# Patient Record
Sex: Male | Born: 1985 | Race: Black or African American | Hispanic: No | Marital: Single | Smoking: Never smoker
Health system: Southern US, Community
[De-identification: ages and names within clinical notes are randomized; demographics above are authoritative.]

---
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
## 2006-07-11 ENCOUNTER — Emergency Department (HOSPITAL_COMMUNITY): Admission: EM | Admit: 2006-07-11 | Discharge: 2006-07-11 | Payer: Self-pay | Admitting: Emergency Medicine

## 2006-07-21 ENCOUNTER — Emergency Department (HOSPITAL_COMMUNITY): Admission: EM | Admit: 2006-07-21 | Discharge: 2006-07-21 | Payer: Self-pay | Admitting: Family Medicine

## 2006-08-20 ENCOUNTER — Encounter: Admission: RE | Admit: 2006-08-20 | Discharge: 2006-08-20 | Payer: Self-pay | Admitting: Family Medicine

## 2012-09-28 ENCOUNTER — Emergency Department (INDEPENDENT_AMBULATORY_CARE_PROVIDER_SITE_OTHER): Payer: BC Managed Care – PPO

## 2012-09-28 ENCOUNTER — Emergency Department (HOSPITAL_COMMUNITY)
Admission: EM | Admit: 2012-09-28 | Discharge: 2012-09-28 | Disposition: A | Discharge status: Home / Self Care | Payer: BC Managed Care – PPO | Source: Home / Self Care

## 2012-09-28 ENCOUNTER — Encounter (HOSPITAL_COMMUNITY): Payer: Self-pay | Admitting: *Deleted

## 2012-09-28 DIAGNOSIS — S93409A Sprain of unspecified ligament of unspecified ankle, initial encounter: Secondary | ICD-10-CM

## 2012-09-28 DIAGNOSIS — S93402A Sprain of unspecified ligament of left ankle, initial encounter: Secondary | ICD-10-CM

## 2012-09-28 NOTE — ED Notes (Signed)
Pt      Was      Playing  Basketball  And  He  Turned  His  Ankle    After  Jumping         Pt     Reports  Pain  When he  Bears  Weight  On    The  Affected   Ankle

## 2012-09-28 NOTE — ED Provider Notes (Signed)
   History    CSN: [id number] Arrival date & time 09/28/12  1534  None    Chief Complaint  Patient presents with  . Ankle Pain   (Consider location/radiation/quality/duration/timing/severity/associated sxs/prior Treatment) Patient is a 27 y.o. male presenting with ankle pain. The history is provided by the patient.  Ankle Pain Location:  Ankle Time since incident:  5 days Injury: yes   Mechanism of injury comment:  Rolled ankle playing bball 5 d ago, still sore lat. Ankle location:  L ankle Chronicity:  New Dislocation: no   Foreign body present:  No foreign bodies Prior injury to area:  Yes (h/o ankle fx.)  History reviewed. No pertinent past medical history. History reviewed. No pertinent past surgical history. History reviewed. No pertinent family history. History  Substance Use Topics  . Smoking status: Never Smoker   . Smokeless tobacco: Not on file  . Alcohol Use: No    Review of Systems  Constitutional: Negative.   Musculoskeletal: Positive for joint swelling. Negative for gait problem.  Skin: Negative.     Allergies  Review of patient's allergies indicates no known allergies.  Home Medications  No current outpatient prescriptions on file. BP 129/68  Pulse 47  Temp(Src) 98.4 F (36.9 C) (Oral)  Resp 16  SpO2 100% Physical Exam  Nursing note and vitals reviewed. Constitutional: He is oriented to person, place, and time. He appears well-developed and well-nourished.  Musculoskeletal: He exhibits tenderness.       Left ankle: He exhibits normal range of motion, no swelling, no deformity and normal pulse. Tenderness. Lateral malleolus tenderness found. No medial malleolus, no AITFL, no CF ligament, no posterior TFL, no head of 5th metatarsal and no proximal fibula tenderness found. Achilles tendon normal.       Left foot: Normal.  Neurological: He is alert and oriented to person, place, and time.  Skin: Skin is warm and dry.    ED Course  Procedures  (including critical care time) Labs Reviewed - No data to display Dg Ankle Complete Left  09/28/2012   *RADIOLOGY REPORT*  Clinical Data: Injury to left ankle with lateral pain.  LEFT ANKLE COMPLETE - 3+ VIEW  Comparison: None.  Findings: Focal soft tissue swelling seen overlying the lateral malleolus.  There is no evidence of acute fracture or dislocation. Nonunion versus old avulsion is seen at the tip of the medial malleolus.  Ankle mortise shows normal alignment.  IMPRESSION: No acute fracture identified.   Original Report Authenticated By: Irish Lack, M.D.   1. Ankle sprain, left, initial encounter     MDM  X-rays reviewed and report per radiologist.   Linna Hoff, MD 09/28/12 [phone]

## 2013-06-24 ENCOUNTER — Emergency Department (HOSPITAL_COMMUNITY): Payer: Worker's Compensation

## 2013-06-24 ENCOUNTER — Emergency Department (HOSPITAL_COMMUNITY)
Admission: EM | Admit: 2013-06-24 | Discharge: 2013-06-24 | Disposition: A | Discharge status: Home / Self Care | Payer: Worker's Compensation | Attending: Emergency Medicine | Admitting: Emergency Medicine

## 2013-06-24 ENCOUNTER — Encounter (HOSPITAL_COMMUNITY): Payer: Self-pay | Admitting: Emergency Medicine

## 2013-06-24 DIAGNOSIS — R109 Unspecified abdominal pain: Secondary | ICD-10-CM

## 2013-06-24 DIAGNOSIS — M549 Dorsalgia, unspecified: Secondary | ICD-10-CM | POA: Insufficient documentation

## 2013-06-24 DIAGNOSIS — R1013 Epigastric pain: Secondary | ICD-10-CM

## 2013-06-24 DIAGNOSIS — R110 Nausea: Secondary | ICD-10-CM

## 2013-06-24 DIAGNOSIS — K3189 Other diseases of stomach and duodenum: Secondary | ICD-10-CM | POA: Insufficient documentation

## 2013-06-24 LAB — CBC WITH DIFFERENTIAL/PLATELET
Basophils Absolute: 0 10*3/uL (ref 0.0–0.1)
Basophils Relative: 0 % (ref 0–1)
EOS PCT: 1 % (ref 0–5)
Eosinophils Absolute: 0.1 10*3/uL (ref 0.0–0.7)
HEMATOCRIT: 43.3 % (ref 39.0–52.0)
Hemoglobin: 15.2 g/dL (ref 13.0–17.0)
LYMPHS ABS: 2.3 10*3/uL (ref 0.7–4.0)
LYMPHS PCT: 33 % (ref 12–46)
MCH: 32.1 pg (ref 26.0–34.0)
MCHC: 35.1 g/dL (ref 30.0–36.0)
MCV: 91.4 fL (ref 78.0–100.0)
MONO ABS: 0.7 10*3/uL (ref 0.1–1.0)
Monocytes Relative: 10 % (ref 3–12)
Neutro Abs: 4 10*3/uL (ref 1.7–7.7)
Neutrophils Relative %: 56 % (ref 43–77)
Platelets: 234 10*3/uL (ref 150–400)
RBC: 4.74 MIL/uL (ref 4.22–5.81)
RDW: 12.8 % (ref 11.5–15.5)
WBC: 7.1 10*3/uL (ref 4.0–10.5)

## 2013-06-24 LAB — COMPREHENSIVE METABOLIC PANEL
ALT: 14 U/L (ref 0–53)
AST: 18 U/L (ref 0–37)
Albumin: 4.1 g/dL (ref 3.5–5.2)
Alkaline Phosphatase: 64 U/L (ref 39–117)
BUN: 14 mg/dL (ref 6–23)
CALCIUM: 9.5 mg/dL (ref 8.4–10.5)
CO2: 30 meq/L (ref 19–32)
CREATININE: 1.16 mg/dL (ref 0.50–1.35)
Chloride: 98 mEq/L (ref 96–112)
GFR, EST NON AFRICAN AMERICAN: 86 mL/min — AB (ref >90)
GLUCOSE: 92 mg/dL (ref 70–99)
Potassium: 3.9 mEq/L (ref 3.7–5.3)
Sodium: 140 mEq/L (ref 137–147)
Total Bilirubin: 0.3 mg/dL (ref 0.3–1.2)
Total Protein: 7.5 g/dL (ref 6.0–8.3)

## 2013-06-24 MED ORDER — ONDANSETRON HCL 4 MG/2ML IJ SOLN
4.0000 mg | Freq: Once | INTRAMUSCULAR | Status: AC
  Administered 2013-06-24: 4 mg via INTRAVENOUS
  Filled 2013-06-24: qty 2

## 2013-06-24 MED ORDER — IOHEXOL 300 MG/ML  SOLN
100.0000 mL | Freq: Once | INTRAMUSCULAR | Status: AC | PRN
  Administered 2013-06-24: 100 mL via INTRAVENOUS

## 2013-06-24 MED ORDER — FENTANYL CITRATE 0.05 MG/ML IJ SOLN
50.0000 ug | Freq: Once | INTRAMUSCULAR | Status: AC
  Administered 2013-06-24: 50 ug via INTRAVENOUS
  Filled 2013-06-24: qty 2

## 2013-06-24 MED ORDER — ONDANSETRON 8 MG PO TBDP: 8.0000 mg | ORAL_TABLET | Freq: Three times a day (TID) | ORAL | Status: AC | PRN

## 2013-06-24 MED ORDER — IOHEXOL 300 MG/ML  SOLN
25.0000 mL | Freq: Once | INTRAMUSCULAR | Status: AC | PRN
  Administered 2013-06-24: 25 mL via ORAL

## 2013-06-24 MED ORDER — FAMOTIDINE 20 MG PO TABS: 20.0000 mg | ORAL_TABLET | Freq: Two times a day (BID) | ORAL | Status: AC

## 2013-06-24 MED ORDER — FAMOTIDINE IN NACL 20-0.9 MG/50ML-% IV SOLN
20.0000 mg | Freq: Once | INTRAVENOUS | Status: AC
  Administered 2013-06-24: 20 mg via INTRAVENOUS
  Filled 2013-06-24: qty 50

## 2013-06-24 NOTE — ED Notes (Signed)
Pt returned from CT via stretcher.

## 2013-06-24 NOTE — ED Notes (Signed)
Patient is alert and orientedx4.  Patient was explained discharge instructions and they understood them with no questions.  The patient's significant other, Maple MirzaShambria Mobley is taking the patient home.

## 2013-06-24 NOTE — ED Provider Notes (Addendum)
CSN: [id number]     Arrival date & time 06/24/13  0319 History   First MD Initiated Contact with Patient 06/24/13 0500     Chief Complaint  Patient presents with  . Abdominal Pain  . Back Pain     (Consider location/radiation/quality/duration/timing/severity/associated sxs/prior Treatment) HPI 28 year old male presents to emergency department with complaint of abdominal and right flank pain.  Patient reports he had an accident at work March 31 where he was pinned him between bleachers and the steering wheel.  He was seen at [employer] [facility] a day or 2 later, and told he had an abdominal bruise.  Patient reports starting about a week ago, 3-4 days after the injury, he began to have worsening pain, mainly in the epigastrium, nausea, with eating, diarrhea, and change in his stool color. No past medical history on file. No past surgical history on file. No family history on file. History  Substance Use Topics  . Smoking status: Never Smoker   . Smokeless tobacco: Not on file  . Alcohol Use: No    Review of Systems  See History of Present Illness; otherwise all other systems are reviewed and negative   Allergies  Review of patient's allergies indicates no known allergies.  Home Medications  No current outpatient prescriptions on file. BP 114/70  Pulse 70  Temp(Src) 98.2 F (36.8 C) (Oral)  Resp 20  Wt 174 lb 12.8 oz (79.289 kg)  SpO2 98% Physical Exam  Nursing note and vitals reviewed. Constitutional: He is oriented to person, place, and time. He appears well-developed and well-nourished. No distress.  HENT:  Head: Normocephalic and atraumatic.  Nose: Nose normal.  Mouth/Throat: Oropharynx is clear and moist.  Eyes: Conjunctivae and EOM are normal. Pupils are equal, round, and reactive to light.  Neck: Normal range of motion. Neck supple. No JVD present. No tracheal deviation present. No thyromegaly present.  Cardiovascular: Normal rate, regular rhythm, normal  heart sounds and intact distal pulses.  Exam reveals no gallop and no friction rub.   No murmur heard. Pulmonary/Chest: Effort normal and breath sounds normal. No stridor. No respiratory distress. He has no wheezes. He has no rales. He exhibits no tenderness.  Abdominal: Soft. Bowel sounds are normal. He exhibits no distension and no mass. There is tenderness (tender to palpation in epigastrium, right flank). There is no rebound and no guarding.  Musculoskeletal: Normal range of motion. He exhibits no edema and no tenderness.  Lymphadenopathy:    He has no cervical adenopathy.  Neurological: He is alert and oriented to person, place, and time. He exhibits normal muscle tone. Coordination normal.  Skin: Skin is warm and dry. No rash noted. No erythema. No pallor.  Psychiatric: He has a normal mood and affect. His behavior is normal. Judgment and thought content normal.    ED Course  Procedures (including critical care time) Labs Review Labs Reviewed  COMPREHENSIVE METABOLIC PANEL - Abnormal; Notable for the following:    GFR calc non Af Amer 86 (*)    All other components within normal limits  CBC WITH DIFFERENTIAL   Imaging Review Ct Abdomen Pelvis W Contrast  06/24/2013   CLINICAL DATA:  Abdominal pain. Nausea and vomiting. Abdominal injury 9 days ago with crush injury. Low back pain.  EXAM: CT ABDOMEN AND PELVIS WITH CONTRAST  TECHNIQUE: Multidetector CT imaging of the abdomen and pelvis was performed using the standard protocol following bolus administration of intravenous contrast.  CONTRAST:  OMNIPAQUE IOHEXOL 300 MG/ML  SOLN  COMPARISON:  None.  FINDINGS: The lung bases are clear without focal nodule, mass, or airspace disease. The heart size is normal. No significant pleural or pericardial effusion is present.  The liver and spleen are within normal limits. The stomach, duodenum, and pancreas are within normal limits. The common bile duct and gallbladder are normal. The adrenal  glands and kidneys are within normal limits. The ureters and urinary bladder are unremarkable. Rectosigmoid colon is within normal limits. The remainder of the colon is unremarkable. The kidneys are visualized and within normal limits. Small bowel is within normal limits is well. There is no significant adenopathy or free fluid.  Bone windows demonstrate no focal lytic or blastic lesions. Vertebral body heights and alignment are normal. There is some straightening of the normal lumbar lordosis.  IMPRESSION: 1. Normal CT of the abdomen and pelvis. No evidence for acute or focal trauma. 2. Mild straightening of the normal lumbar lordosis. While in a osseous trauma is present, this can be seen in the setting of muscle strain or ongoing pain.   Electronically Signed   By: Gennette Pachris  Mattern M.D.   On: 06/24/2013 07:22     EKG Interpretation None      MDM   Final diagnoses:  Abdominal pain  Nausea  Dyspepsia    28 year old male with persistent abdominal pain after abdominal flank injury at work.  Labs are reassuring.  He has mild epigastrium tenderness to palpation.  He denies any excessive use of NSAIDs, Pepto-Bismol.  He does not drink alcohol.  Plan for CT abdomen pelvis, looking for possible delayed duodenal injury.  Symptoms seem more like gastritis, will give a dose of Pepcid here IV.  Olivia Mackielga M Aylssa Herrig, MD 06/24/13 0708  7:34 AM CT scan, normal.  Patient feeling better after medications.  We'll send him home on Zofran and Pepcid, have him stick to a bland diet.  In talking with him, it sounds like he eats a lot of fast food.  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  Olivia Mackielga M Heer Justiss, MD 06/24/13 [phone]

## 2013-06-24 NOTE — ED Notes (Signed)
Pt finished drinking oral CT contrast and CT made aware. 

## 2013-06-24 NOTE — Discharge Instructions (Signed)
Your workup today did not show any signs of abdominal injury.  Your lab work and CT scan were normal.  Stick to a bland diet, avoid spicy foods, fried or greasy foods.  Take medications as prescribed.  Follow up with [facility] in 3-5 days.   Abdominal Pain, Adult Many things can cause abdominal pain. Usually, abdominal pain is not caused by a disease and will improve without treatment. It can often be observed and treated at home. Your health care provider will do a physical exam and possibly order blood tests and X-rays to help determine the seriousness of your pain. However, in many cases, more time must pass before a clear cause of the pain can be found. Before that point, your health care provider may not know if you need more testing or further treatment. HOME CARE INSTRUCTIONS  Monitor your abdominal pain for any changes. The following actions may help to alleviate any discomfort you are experiencing:  Only take over-the-counter or prescription medicines as directed by your health care provider.  Do not take laxatives unless directed to do so by your health care provider.  Try a clear liquid diet (broth, tea, or water) as directed by your health care provider. Slowly move to a bland diet as tolerated. SEEK MEDICAL CARE IF:  You have unexplained abdominal pain.  You have abdominal pain associated with nausea or diarrhea.  You have pain when you urinate or have a bowel movement.  You experience abdominal pain that wakes you in the night.  You have abdominal pain that is worsened or improved by eating food.  You have abdominal pain that is worsened with eating fatty foods. SEEK IMMEDIATE MEDICAL CARE IF:   Your pain does not go away within 2 hours.  You have a fever.  You keep throwing up (vomiting).  Your pain is felt only in portions of the abdomen, such as the right side or the left lower portion of the abdomen.  You pass bloody or black tarry stools. MAKE SURE  YOU:  Understand these instructions.   Will watch your condition.   Will get help right away if you are not doing well or get worse.  Document Released: 12/11/2004 Document Revised: 12/22/2012 Document Reviewed: 11/10/2012 [facility] Patient Information 2014 [place], [place].  Diet for Diarrhea, Adult Frequent, runny stools (diarrhea) may be caused or worsened by food or drink. Diarrhea may be relieved by changing your diet. Since diarrhea can last up to 7 days, it is easy for you to lose too much fluid from the body and become dehydrated. Fluids that are lost need to be replaced. Along with a modified diet, make sure you drink enough fluids to keep your urine clear or pale yellow. DIET INSTRUCTIONS  Ensure adequate fluid intake (hydration): have 1 cup (8 oz) of fluid for each diarrhea episode. Avoid fluids that contain simple sugars or sports drinks, fruit juices, whole milk products, and sodas. Your urine should be clear or pale yellow if you are drinking enough fluids. Hydrate with an oral rehydration solution that you can purchase at pharmacies, retail stores, and online. You can prepare an oral rehydration solution at home by mixing the following ingredients together:    tsp table salt.   tsp baking soda.   tsp salt substitute containing potassium chloride.  1  tablespoons sugar.  1 L (34 oz) of water.  Certain foods and beverages may increase the speed at which food moves through the gastrointestinal (GI) tract. These foods and  beverages should be avoided and include:  Caffeinated and alcoholic beverages.  High-fiber foods, such as raw fruits and vegetables, nuts, seeds, and whole grain breads and cereals.  Foods and beverages sweetened with sugar alcohols, such as xylitol, sorbitol, and mannitol.  Some foods may be well tolerated and may help thicken stool including:  Starchy foods, such as rice, toast, pasta, low-sugar cereal, oatmeal, grits, baked potatoes, crackers, and  bagels.   Bananas.   Applesauce.  Add probiotic-rich foods to help increase healthy bacteria in the GI tract, such as yogurt and fermented milk products. RECOMMENDED FOODS AND BEVERAGES Starches Choose foods with less than 2 g of fiber per serving.  Recommended:  White, [place], and pita breads, plain rolls, buns, bagels. Plain muffins, matzo. Soda, saltine, or graham crackers. Pretzels, melba toast, zwieback. Cooked cereals made with water: cornmeal, farina, cream cereals. Dry cereals: refined corn, wheat, rice. Potatoes prepared any way without skins, refined macaroni, spaghetti, noodles, refined rice.  Avoid:  Bread, rolls, or crackers made with whole wheat, multi-grains, rye, bran seeds, nuts, or coconut. Corn tortillas or taco shells. Cereals containing whole grains, multi-grains, bran, coconut, nuts, raisins. Cooked or dry oatmeal. Coarse wheat cereals, granola. Cereals advertised as "high-fiber." Potato skins. Whole grain pasta, wild or brown rice. Popcorn. Sweet potatoes, yams. Sweet rolls, doughnuts, waffles, pancakes, sweet breads. Vegetables  Recommended: Strained tomato and vegetable juices. Most well-cooked and canned vegetables without seeds. Fresh: Tender lettuce, cucumber without the skin, cabbage, spinach, bean sprouts.  Avoid: Fresh, cooked, or canned: Artichokes, baked beans, beet greens, broccoli, Brussels sprouts, corn, kale, legumes, peas, sweet potatoes. Cooked: Green or red cabbage, spinach. Avoid large servings of any vegetables because vegetables shrink when cooked, and they contain more fiber per serving than fresh vegetables. Fruit  Recommended: Cooked or canned: Apricots, applesauce, cantaloupe, cherries, fruit cocktail, grapefruit, grapes, kiwi, mandarin oranges, peaches, pears, plums, watermelon. Fresh: Apples without skin, ripe banana, grapes, cantaloupe, cherries, grapefruit, peaches, oranges, plums. Keep servings limited to  cup or 1 piece.  Avoid: Fresh:  Apples with skin, apricots, mangoes, pears, raspberries, strawberries. Prune juice, stewed or dried prunes. Dried fruits, raisins, dates. Large servings of all fresh fruits. Protein  Recommended: Ground or well-cooked tender beef, ham, veal, lamb, pork, or poultry. Eggs. Fish, oysters, shrimp, lobster, other seafoods. Liver, organ meats.  Avoid: Tough, fibrous meats with gristle. Peanut butter, smooth or chunky. Cheese, nuts, seeds, legumes, dried peas, beans, lentils. Dairy  Recommended: Yogurt, lactose-free milk, kefir, drinkable yogurt, buttermilk, soy milk, or plain hard cheese.  Avoid: Milk, chocolate milk, beverages made with milk, such as milkshakes. Soups  Recommended: Bouillon, broth, or soups made from allowed foods. Any strained soup.  Avoid: Soups made from vegetables that are not allowed, cream or milk-based soups. Desserts and Sweets  Recommended: Sugar-free gelatin, sugar-free frozen ice pops made without sugar alcohol.  Avoid: Plain cakes and cookies, pie made with fruit, pudding, custard, cream pie. Gelatin, fruit, ice, sherbet, frozen ice pops. Ice cream, ice milk without nuts. Plain hard candy, honey, jelly, molasses, syrup, sugar, chocolate syrup, gumdrops, marshmallows. Fats and Oils  Recommended: Limit fats to less than 8 tsp per day.  Avoid: Seeds, nuts, olives, avocados. Margarine, butter, cream, mayonnaise, salad oils, plain salad dressings. Plain gravy, crisp bacon without rind. Beverages  Recommended: Water, decaffeinated teas, oral rehydration solutions, sugar-free beverages not sweetened with sugar alcohols.  Avoid: Fruit juices, caffeinated beverages (coffee, tea, soda), alcohol, sports drinks, or lemon-lime soda. Condiments  Recommended: Ketchup, mustard, horseradish,  vinegar, cocoa powder. Spices in moderation: allspice, basil, bay leaves, celery powder or leaves, cinnamon, cumin powder, curry powder, ginger, mace, marjoram, onion or garlic powder,  oregano, paprika, parsley flakes, ground pepper, rosemary, sage, savory, tarragon, thyme, turmeric.  Avoid: Coconut, honey. Document Released: 05/24/2003 Document Revised: 11/26/2011 Document Reviewed: 07/18/2011 [facility] Patient Information 2014 [place], [place].  Indigestion Indigestion is discomfort in the upper abdomen that is caused by underlying problems such as gastroesophageal reflux disease (GERD), ulcers, or gallbladder problems.  CAUSES  Indigestion can be caused by many things. Possible causes include:  Stomach acid in the esophagus.  Stomach infections, usually caused by the bacteria H. pylori.  Being overweight.  Hiatal hernia. This means part of the stomach pushes up through the diaphragm.  Overeating.  Emotional problems, such as stress, anxiety, or depression.  Poor nutrition.  Consuming too much alcohol, tobacco, or caffeine.  Consuming spicy foods, fats, peppermint, chocolate, tomato products, citrus, or fruit juices.  Medicines such as aspirin and other anti-inflammatory drugs, hormones, steroids, and thyroid medicines.  Gastroparesis. This is a condition in which the stomach does not empty properly.  Stomach cancer.  Pregnancy, due to an increase in hormone levels, a relaxation of muscles in the digestive tract, and pressure on the stomach from the growing fetus. SYMPTOMS   Uncomfortable feeling of fullness after eating.  Pain or burning sensation in the upper abdomen.  Bloating.  Belching and gas.  Nausea and vomiting.  Acidic taste in the mouth.  Burning sensation in the chest (heartburn). DIAGNOSIS  Your caregiver will review your medical history and perform a physical exam. Other tests, such as blood tests, stool tests, X-rays, and other imaging scans, may be done to check for more serious problems. TREATMENT  Liquid antacids and other drugs may be given to block stomach acid secretion. Medicines that increase esophageal muscle tone may  also be given to help reduce symptoms. If an infection is found, antibiotic medicine may be given. HOME CARE INSTRUCTIONS  Avoid foods and drinks that make your symptoms worse, such as:  Caffeine or alcoholic drinks.  Chocolate.  Peppermint or mint flavorings.  Garlic and onions.  Spicy foods.  Citrus fruits, such as oranges, lemons, or limes.  Tomato-based foods such as sauce, chili, salsa, and pizza.  Fried and fatty foods.  Avoid eating for the 3 hours prior to your bedtime.  Eat small, frequent meals instead of large meals.  Stop smoking if you smoke.  Maintain a healthy weight.  Wear loose-fitting clothing. Do not wear anything tight around your waist that causes pressure on your stomach.  Raise the head of your bed 4 to 8 inches with wood blocks to help you sleep. Extra pillows will not help.  Only take over-the-counter or prescription medicines as directed by your caregiver.  Do not take aspirin, ibuprofen, or other nonsteroidal anti-inflammatory drugs (NSAIDs). SEEK IMMEDIATE MEDICAL CARE IF:   You are not better after 2 days.  You have chest pressure or pain that radiates up into your neck, arms, back, jaw, or upper abdomen.  You have difficulty swallowing.  You keep vomiting.  You have black or bloody stools.  You have a fever.  You have dizziness, fainting, difficulty breathing, or heavy sweating.  You have severe abdominal pain.  You lose weight without trying. MAKE SURE YOU:  Understand these instructions.  Will watch your condition.  Will get help right away if you are not doing well or get worse. Document Released: 04/10/2004 Document Revised:  05/26/2011 Document Reviewed: 10/16/2010 [employer] Patient Information 2014 [employer], [place].  Nausea and Vomiting Nausea is a sick feeling that often comes before throwing up (vomiting). Vomiting is a reflex where stomach contents come out of your mouth. Vomiting can cause severe loss of body  fluids (dehydration). Children and elderly adults can become dehydrated quickly, especially if they also have diarrhea. Nausea and vomiting are symptoms of a condition or disease. It is important to find the cause of your symptoms. CAUSES   Direct irritation of the stomach lining. This irritation can result from increased acid production (gastroesophageal reflux disease), infection, food poisoning, taking certain medicines (such as nonsteroidal anti-inflammatory drugs), alcohol use, or tobacco use.  Signals from the brain.These signals could be caused by a headache, heat exposure, an inner ear disturbance, increased pressure in the brain from injury, infection, a tumor, or a concussion, pain, emotional stimulus, or metabolic problems.  An obstruction in the gastrointestinal tract (bowel obstruction).  Illnesses such as diabetes, hepatitis, gallbladder problems, appendicitis, kidney problems, cancer, sepsis, atypical symptoms of a heart attack, or eating disorders.  Medical treatments such as chemotherapy and radiation.  Receiving medicine that makes you sleep (general anesthetic) during surgery. DIAGNOSIS Your caregiver may ask for tests to be done if the problems do not improve after a few days. Tests may also be done if symptoms are severe or if the reason for the nausea and vomiting is not clear. Tests may include:  Urine tests.  Blood tests.  Stool tests.  Cultures (to look for evidence of infection).  X-rays or other imaging studies. Test results can help your caregiver make decisions about treatment or the need for additional tests. TREATMENT You need to stay well hydrated. Drink frequently but in small amounts.You may wish to drink water, sports drinks, clear broth, or eat frozen ice pops or gelatin dessert to help stay hydrated.When you eat, eating slowly may help prevent nausea.There are also some antinausea medicines that may help prevent nausea. HOME CARE INSTRUCTIONS    Take all medicine as directed by your caregiver.  If you do not have an appetite, do not force yourself to eat. However, you must continue to drink fluids.  If you have an appetite, eat a normal diet unless your caregiver tells you differently.  Eat a variety of complex carbohydrates (rice, wheat, potatoes, bread), lean meats, yogurt, fruits, and vegetables.  Avoid high-fat foods because they are more difficult to digest.  Drink enough water and fluids to keep your urine clear or pale yellow.  If you are dehydrated, ask your caregiver for specific rehydration instructions. Signs of dehydration may include:  Severe thirst.  Dry lips and mouth.  Dizziness.  Dark urine.  Decreasing urine frequency and amount.  Confusion.  Rapid breathing or pulse. SEEK IMMEDIATE MEDICAL CARE IF:   You have blood or brown flecks (like coffee grounds) in your vomit.  You have black or bloody stools.  You have a severe headache or stiff neck.  You are confused.  You have severe abdominal pain.  You have chest pain or trouble breathing.  You do not urinate at least once every 8 hours.  You develop cold or clammy skin.  You continue to vomit for longer than 24 to 48 hours.  You have a fever. MAKE SURE YOU:   Understand these instructions.  Will watch your condition.  Will get help right away if you are not doing well or get worse. Document Released: 03/03/2005 Document Revised: 05/26/2011 Document Reviewed:  07/31/2010 [employer] Patient Information 2014 [place].

## 2013-06-24 NOTE — ED Notes (Signed)
Pt states that he was in an accident at work and is now having lower back pain and abd pain.  Pt states he is "not able to eat like he normally is"  Pt also states black tary stools for two days

## 2013-06-24 NOTE — ED Notes (Signed)
Introduced to myself to the patient and his support person.  Both were asleep and resting.

## 2015-02-16 LAB — HM HEPATITIS C SCREENING LAB: HM Hepatitis Screen: NEGATIVE

## 2015-08-11 IMAGING — CT CT ABD-PELV W/ CM
2 of 5 series · 17 of 46 positions shown, 19 images · IV contrast (omnipaque)
Comparison: None.

CLINICAL DATA: Abdominal pain. Nausea and vomiting. Abdominal
injury 9 days ago with crush injury. Low back pain.

EXAM:
CT ABDOMEN AND PELVIS WITH CONTRAST
TECHNIQUE: Multidetector CT imaging of the abdomen and pelvis was performed
using the standard protocol following bolus administration of
intravenous contrast.
CONTRAST:  100mL OMNIPAQUE IOHEXOL 300 MG/ML  SOLN

[Series 2: routine · axial · 0.62mm/px · z∈[+107,+497]mm · 14 of 88 slices shown, 16 images]
[im 5/88  soft-tissue]
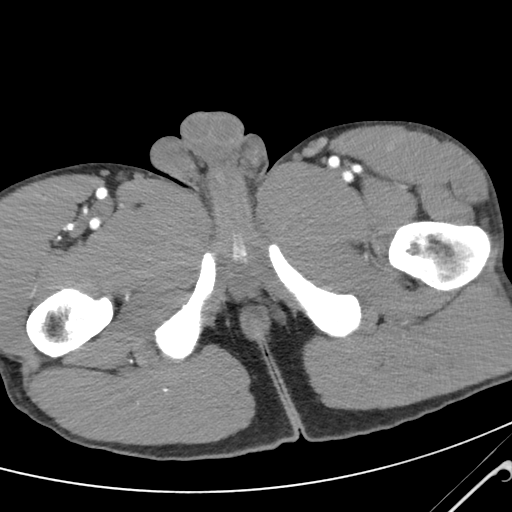
[im 5/88  bone]
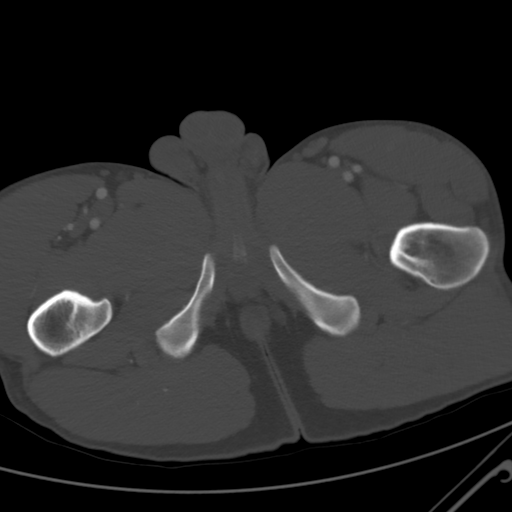
[im 10/88  soft-tissue]
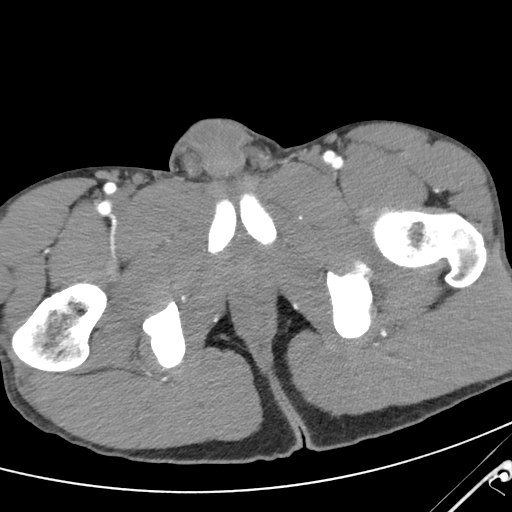
[im 20/88  soft-tissue]
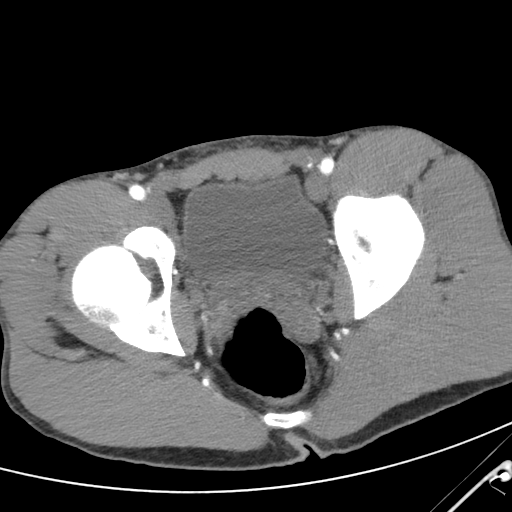
[im 25/88  soft-tissue]
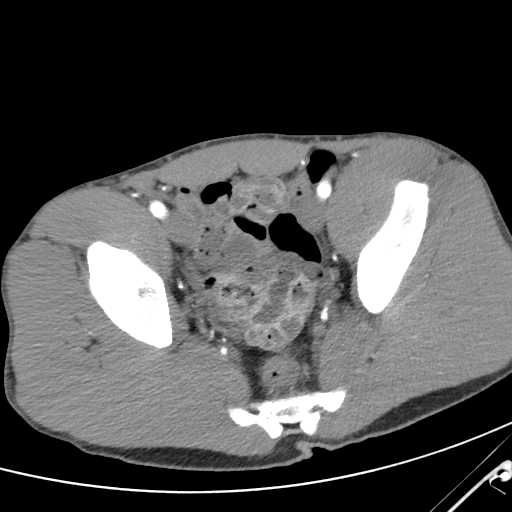
[im 30/88  soft-tissue]
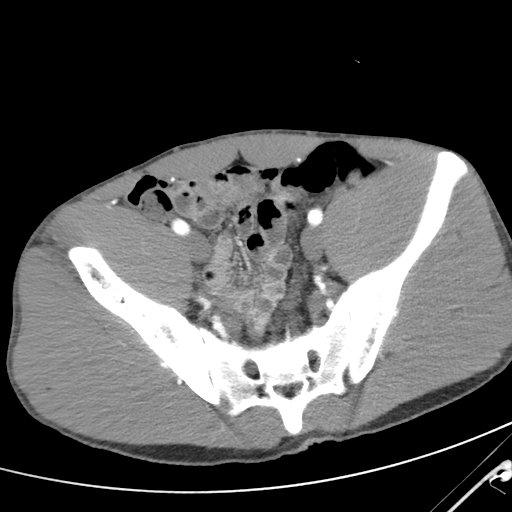
[im 34/88  soft-tissue]
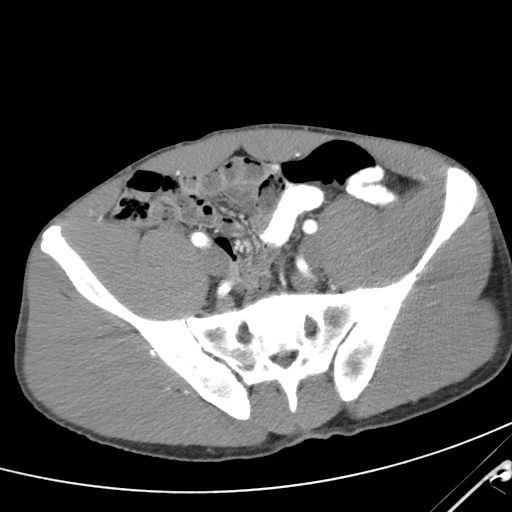
[im 39/88  soft-tissue]
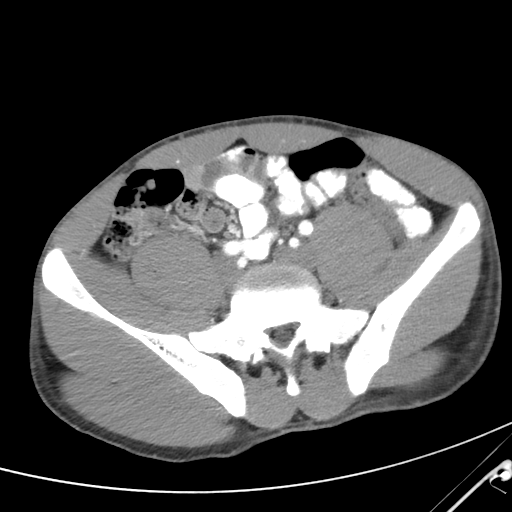
[im 49/88  soft-tissue]
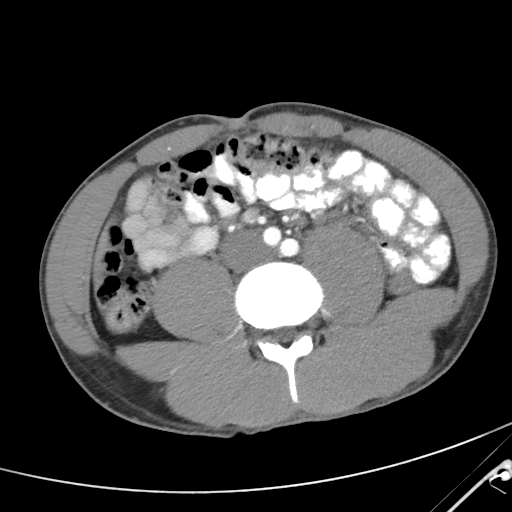
[im 54/88  soft-tissue]
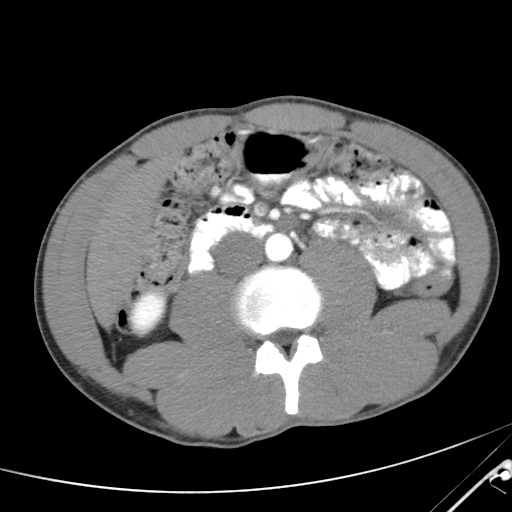
[im 54/88  bone]
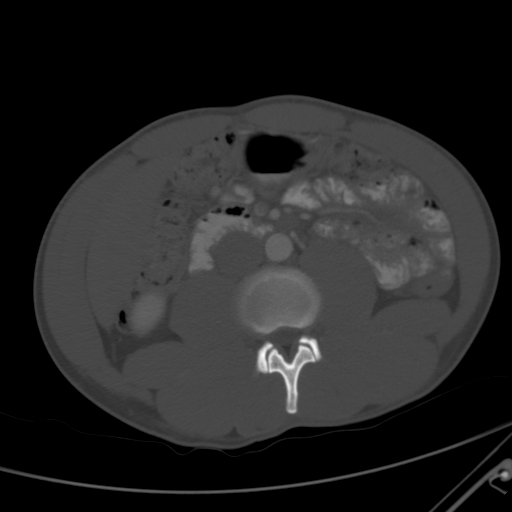
[im 59/88  soft-tissue]
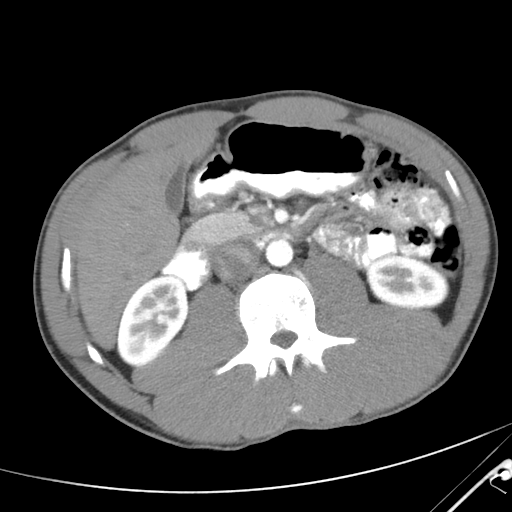
[im 63/88  soft-tissue]
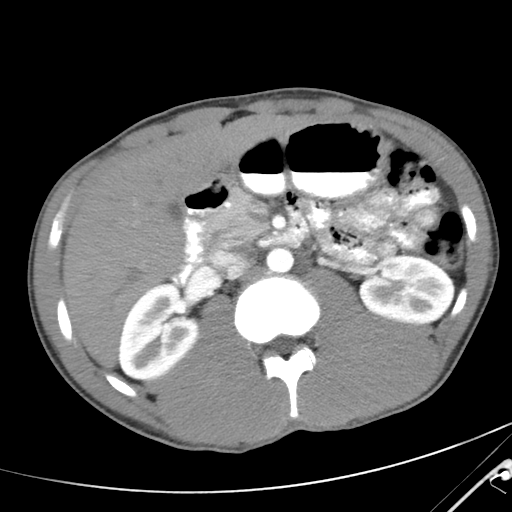
[im 68/88  soft-tissue]
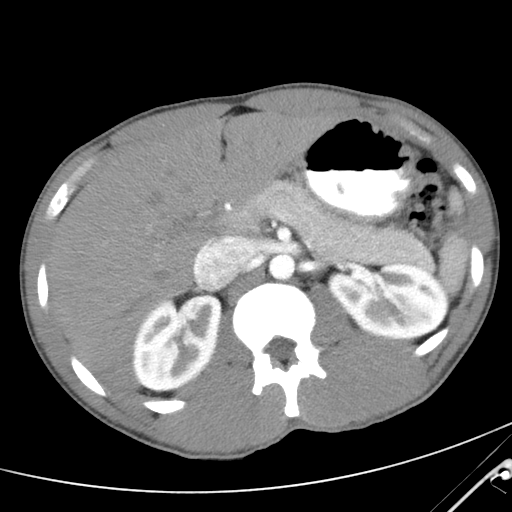
[im 78/88  soft-tissue]
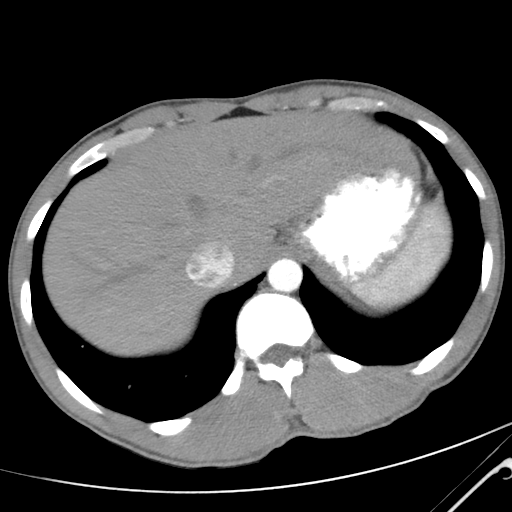
[im 83/88  soft-tissue]
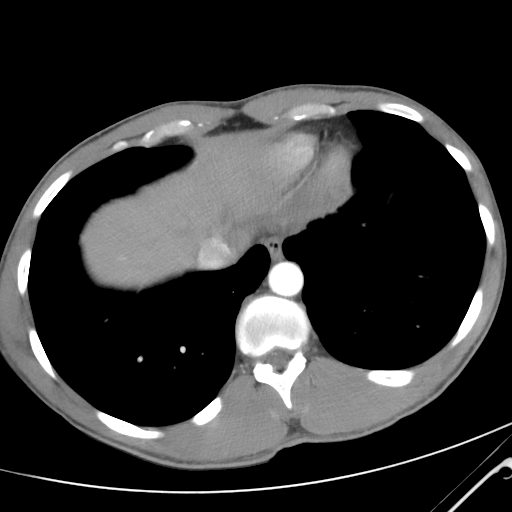

[Series 8048: mpr, coronals, coronal · coronal · 0.85mm/px · 3 of 88 slices shown]
[im 30/88  soft-tissue]
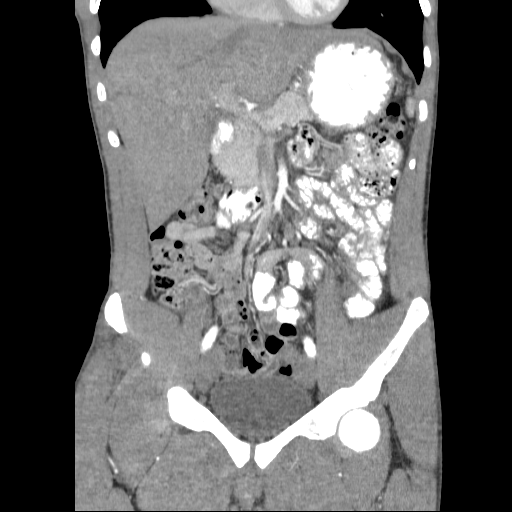
[im 39/88  soft-tissue]
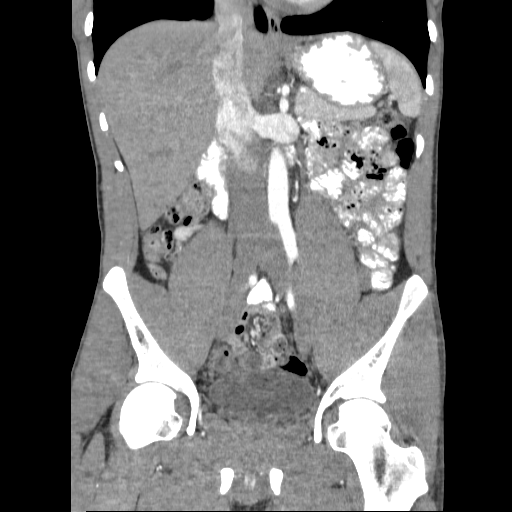
[im 49/88  soft-tissue]
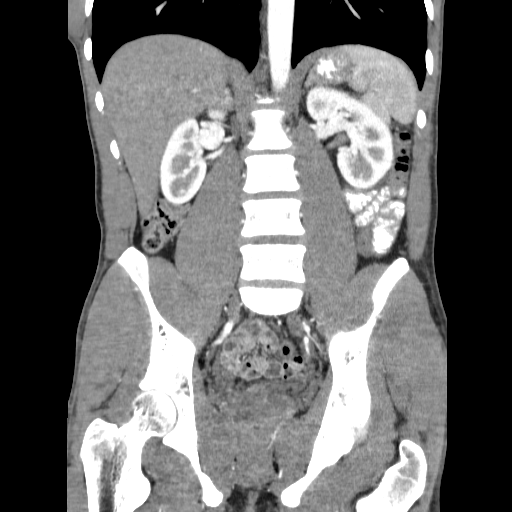

[17 of 46 positions shown; findings below may reference images not displayed]

FINDINGS: The lung bases are clear without focal nodule, mass, or airspace
disease. The heart size is normal. No significant pleural or
pericardial effusion is present.

The liver and spleen are within normal limits. The stomach,
duodenum, and pancreas are within normal limits. The common bile
duct and gallbladder are normal. The adrenal glands and kidneys are
within normal limits. The ureters and urinary bladder are
unremarkable. Rectosigmoid colon is within normal limits. The
remainder of the colon is unremarkable. The kidneys are visualized
and within normal limits. Small bowel is within normal limits is
well. There is no significant adenopathy or free fluid.

Bone windows demonstrate no focal lytic or blastic lesions.
Vertebral body heights and alignment are normal. There is some
straightening of the normal lumbar lordosis.
IMPRESSION: 1. Normal CT of the abdomen and pelvis. No evidence for acute or
focal trauma.
2. Mild straightening of the normal lumbar lordosis. While in a
osseous trauma is present, this can be seen in the setting of muscle
strain or ongoing pain.

## 2016-01-14 ENCOUNTER — Emergency Department (HOSPITAL_COMMUNITY)
Admission: EM | Admit: 2016-01-14 | Discharge: 2016-01-14 | Disposition: A | Discharge status: Home / Self Care | Payer: Self-pay | Attending: Emergency Medicine | Admitting: Emergency Medicine

## 2016-01-14 ENCOUNTER — Encounter (HOSPITAL_COMMUNITY): Payer: Self-pay | Admitting: Emergency Medicine

## 2016-01-14 DIAGNOSIS — K0889 Other specified disorders of teeth and supporting structures: Secondary | ICD-10-CM | POA: Insufficient documentation

## 2016-01-14 DIAGNOSIS — Z79899 Other long term (current) drug therapy: Secondary | ICD-10-CM | POA: Insufficient documentation

## 2016-01-14 MED ORDER — PENICILLIN V POTASSIUM 500 MG PO TABS: 500.0000 mg | ORAL_TABLET | Freq: Four times a day (QID) | ORAL | 0 refills | Status: AC

## 2016-01-14 NOTE — Discharge Instructions (Signed)
Please read attached information. If you experience any new or worsening signs or symptoms please return to the emergency room for evaluation. Please follow-up with your primary care provider or specialist as discussed.  °

## 2016-01-14 NOTE — ED Triage Notes (Signed)
Patient presents with dental pain on the right side.  He states that his wisdom teeth have been bothering him but that this pain has gotten worse on the right side to the point that it is swollen, difficult to chew and swallow.  Pt has not been to the dentist in 3+ years.  Dark spot/hole on the back right molar noted.

## 2016-01-14 NOTE — ED Provider Notes (Signed)
[facility]-EMERGENCY DEPT Provider Note    By signing my name below, I, Earmon PhoenixJennifer Waddell, attest that this documentation has been prepared under the direction and in the presence of Mohawk IndustriesJeff Mylo Choi, PA-C. Electronically Signed: Earmon PhoenixJennifer Waddell, ED Scribe. 01/14/16. 3:04 PM.    History   Chief Complaint Chief Complaint  Patient presents with  . Dental Pain   The history is provided by the patient and medical records. No language interpreter was used.    HPI Comments:  Brian Blevins is a 30 y.o. male who presents to the Emergency Department complaining of right lower and upper dental pain that has been ongoing. He states he believes the pain is coming from his wisdom teeth. He has not taken anything for pain. He denies modifying factors. He denies fever, chills, nausea, vomiting, drooling, trismus or difficulty swallowing. He states he has a Education officer, communitydentist but not locally.   History reviewed. No pertinent past medical history.  There are no active problems to display for this patient.   History reviewed. No pertinent surgical history.     Home Medications    Prior to Admission medications   Medication Sig Start Date End Date Taking? Authorizing Provider  famotidine (PEPCID) 20 MG tablet Take 1 tablet (20 mg total) by mouth 2 (two) times daily. 06/24/13   Marisa Severinlga Otter, MD  ondansetron (ZOFRAN ODT) 8 MG disintegrating tablet Take 1 tablet (8 mg total) by mouth every 8 (eight) hours as needed for nausea or vomiting. 06/24/13   Marisa Severinlga Otter, MD  penicillin v potassium (VEETID) 500 MG tablet Take 1 tablet (500 mg total) by mouth 4 (four) times daily. 01/14/16 01/21/16  Eyvonne MechanicJeffrey Eh Sesay, PA-C    Family History No family history on file.  Social History Social History  Substance Use Topics  . Smoking status: Never Smoker  . Smokeless tobacco: Never Used  . Alcohol use Yes     Comment: Socially     Allergies   Review of patient's allergies indicates no known allergies.   Review of  Systems Review of Systems A complete 10 system review of systems was obtained and all systems are negative except as noted in the HPI and PMH.    Physical Exam Updated Vital Signs BP 111/66 (BP Location: Left Arm)   Pulse 89   Temp 98 F (36.7 C)   Resp 16   Ht 6\' 3"  (1.905 m)   Wt 180 lb (81.6 kg)   SpO2 99%   BMI 22.50 kg/m   Physical Exam  Constitutional: He is oriented to person, place, and time. He appears well-developed and well-nourished.  HENT:  Head: Normocephalic and atraumatic.  Mouth/Throat: Uvula is midline and mucous membranes are normal. No trismus in the jaw. No dental abscesses.  No signs of intraoral swelling, uvula midline and rises with phonation. Tenderness to palpation of the posterior jaw surrounding the third molar. Molar is in with no signs of significant impaction., Is palpated with no obvious fluctuance for the mouth is soft.  Neck: Normal range of motion.  Cardiovascular: Normal rate.   Pulmonary/Chest: Effort normal.  Musculoskeletal: Normal range of motion.  Neurological: He is alert and oriented to person, place, and time.  Skin: Skin is warm and dry.  Psychiatric: He has a normal mood and affect. His behavior is normal.  Nursing note and vitals reviewed.    ED Treatments / Results  DIAGNOSTIC STUDIES: Oxygen Saturation is 99% on RA, normal by my interpretation.   COORDINATION OF CARE: 3:02 PM-  Advised pt to take Ibuprofen and Tylenol for pain and inflammation. Encouraged pt to follow up with dentist and will give resources. Pt verbalizes understanding and agrees to plan.  Medications - No data to display  Labs (all labs ordered are listed, but only abnormal results are displayed) Labs Reviewed - No data to display  EKG  EKG Interpretation None       Radiology No results found.  Procedures Procedures (including critical care time)  Medications Ordered in ED Medications - No data to display   Initial Impression /  Assessment and Plan / ED Course  I have reviewed the triage vital signs and the nursing notes.  Pertinent labs & imaging results that were available during my care of the patient were reviewed by me and considered in my medical decision making (see chart for details).  Clinical Course    I personally performed the services described in this documentation, which was scribed in my presence. The recorded information has been reviewed and is accurate.    Final Clinical Impressions(s) / ED Diagnoses   Final diagnoses:  Pain, dental   Labs:  Imaging:  Consults:  Therapeutics:  Discharge Meds:   Assessment/Plan:  Patient presents with uncomplicated dental pain. No signs of infectious etiology. Patient given dental resources, symptomatic care instructions and return precautions.  As callback patient room by nursing staff reports patient's girlfriend is on the phone. She reports that she is a Engineer, civil (consulting)nurse and she is concerned that he has an infection. She reports that symptoms have been ongoing and painful for the patient. I explained to her that he had no signs of infectious etiology on exam that there was no indication for further imaging or management here in the ED. She requested antibiotics for the patient, I discussed with the patient this is a reasonable request in the event patient does have a small and appreciable infection. Patient's girlfriend is requesting narcotic pain medication, I informed her that that was not appropriate in the setting.    New Prescriptions New Prescriptions   PENICILLIN V POTASSIUM (VEETID) 500 MG TABLET    Take 1 tablet (500 mg total) by mouth 4 (four) times daily.     Eyvonne MechanicJeffrey Yuliza Cara, PA-C 01/14/16 1507    Eyvonne MechanicJeffrey Shavy Beachem, PA-C 01/14/16 1520    Melene Planan Floyd, DO 01/14/16 [phone]

## 2021-07-16 NOTE — Progress Notes (Signed)
? ?New Patient Office Visit ? ?Subjective   ? ?Patient ID: [facility], male    DOB: 01/05/1986  Age: 36 y.o. MRN: [id number] ? ?CC:  ?Chief Complaint  ?Patient presents with  ? Establish Care  ?  Np. Est care. No main concerns. CPE. Pt is fasting.   ? ? ?HPI ?[facility] presents for new patient visit to establish care.  Introduced to Publishing rights manager role and practice setting.  All questions answered.  Discussed provider/patient relationship and expectations. ? ?Brian Blevins recently moved back to the area and would like an overall physical with lab work done today.  He has no concerns. ? ?Depression and Anxiety Screen Done: ? ? ?  07/18/2021  ? 11:24 AM  ?Depression screen PHQ 2/9  ?Decreased Interest 1  ?Down, Depressed, Hopeless 1  ?PHQ - 2 Score 2  ?Altered sleeping 1  ?Tired, decreased energy 0  ?Change in appetite 1  ?Feeling bad or failure about yourself  1  ?Trouble concentrating 0  ?Moving slowly or fidgety/restless 0  ?Suicidal thoughts 0  ?PHQ-9 Score 5  ?Difficult doing work/chores Not difficult at all  ? ? ?  07/18/2021  ? 11:24 AM  ?GAD 7 : Generalized Anxiety Score  ?Nervous, Anxious, on Edge 0  ?Control/stop worrying 1  ?Worry too much - different things 0  ?Trouble relaxing 0  ?Restless 0  ?Easily annoyed or irritable 0  ?Afraid - awful might happen 0  ?Total GAD 7 Score 1  ? ? ?Outpatient Encounter Medications as of 07/18/2021  ?Medication Sig  ? famotidine (PEPCID) 20 MG tablet Take 1 tablet (20 mg total) by mouth 2 (two) times daily.  ? ondansetron (ZOFRAN ODT) 8 MG disintegrating tablet Take 1 tablet (8 mg total) by mouth every 8 (eight) hours as needed for nausea or vomiting.  ? ?No facility-administered encounter medications on file as of 07/18/2021.  ? ? ?History reviewed. No pertinent past medical history. ? ?History reviewed. No pertinent surgical history. ? ?Family History  ?Problem Relation Age of Onset  ? Diabetes Mother   ? ? ?Social History  ? ?Socioeconomic History  ? Marital  status: Single  ?  Spouse name: Not on file  ? Number of children: Not on file  ? Years of education: Not on file  ? Highest education level: Not on file  ?Occupational History  ? Not on file  ?Tobacco Use  ? Smoking status: Never  ? Smokeless tobacco: Never  ?Vaping Use  ? Vaping Use: Never used  ?Substance and Sexual Activity  ? Alcohol use: Yes  ?  Comment: Socially  ? Drug use: Yes  ?  Types: Marijuana  ? Sexual activity: Not on file  ?Other Topics Concern  ? Not on file  ?Social History Narrative  ? Not on file  ? ?Social Determinants of Health  ? ?Financial Resource Strain: Not on file  ?Food Insecurity: Not on file  ?Transportation Needs: Not on file  ?Physical Activity: Not on file  ?Stress: Not on file  ?Social Connections: Not on file  ?Intimate Partner Violence: Not on file  ? ? ?Review of Systems  ?Constitutional: Negative.   ?HENT: Negative.    ?Eyes: Negative.   ?Respiratory: Negative.    ?Cardiovascular: Negative.   ?Gastrointestinal: Negative.   ?Genitourinary: Negative.   ?Musculoskeletal: Negative.   ?Skin: Negative.   ?Neurological: Negative.   ?Psychiatric/Behavioral: Negative.    ? ?  ? ? ?Objective   ? ?BP 92/60 (BP Location: Right  Arm, Patient Position: Sitting, Cuff Size: Normal)   Pulse (!) 52   Temp (!) 96.6 ?F (35.9 ?C) (Temporal)   Ht 6\' 3"  (1.905 m)   Wt 168 lb 12.8 oz (76.6 kg)   SpO2 96%   BMI 21.10 kg/m?  ? ?Physical Exam ?Vitals and nursing note reviewed.  ?Constitutional:   ?   General: He is not in acute distress. ?   Appearance: Normal appearance.  ?HENT:  ?   Head: Normocephalic and atraumatic.  ?   Right Ear: Tympanic membrane, ear canal and external ear normal.  ?   Left Ear: Tympanic membrane, ear canal and external ear normal.  ?Eyes:  ?   Conjunctiva/sclera: Conjunctivae normal.  ?Cardiovascular:  ?   Rate and Rhythm: Normal rate and regular rhythm.  ?   Pulses: Normal pulses.  ?   Heart sounds: Normal heart sounds.  ?Pulmonary:  ?   Effort: Pulmonary effort is normal.   ?   Breath sounds: Normal breath sounds.  ?Abdominal:  ?   Palpations: Abdomen is soft.  ?   Tenderness: There is no abdominal tenderness.  ?Musculoskeletal:     ?   General: Normal range of motion.  ?   Cervical back: Normal range of motion and neck supple. No tenderness.  ?   Right lower leg: No edema.  ?   Left lower leg: No edema.  ?Lymphadenopathy:  ?   Cervical: No cervical adenopathy.  ?Skin: ?   General: Skin is warm and dry.  ?Neurological:  ?   General: No focal deficit present.  ?   Mental Status: He is alert and oriented to person, place, and time.  ?   Cranial Nerves: No cranial nerve deficit.  ?   Coordination: Coordination normal.  ?   Gait: Gait normal.  ?Psychiatric:     ?   Mood and Affect: Mood normal.     ?   Behavior: Behavior normal.     ?   Thought Content: Thought content normal.     ?   Judgment: Judgment normal.  ?  ? ?Assessment & Plan:  ? ?Problem List Items Addressed This Visit   ?None ?Visit Diagnoses   ? ? Routine general medical examination at a health care facility    -  Primary  ? Health maintenance reviewed and updated. Discussed nutrition and exercise. Check CMP, CBC. Follow-up 1 year.  ? Relevant Orders  ? CBC with Differential/Platelet  ? Comprehensive metabolic panel  ? Encounter for lipid screening for cardiovascular disease      ? Screen lipid panel today  ? Relevant Orders  ? Lipid panel  ? ?  ? ?IMMUNIZATIONS:   ?- Tdap: Tetanus vaccination status reviewed: last tetanus booster within 10 years. ?- Influenza: Postponed to flu season ?- Pneumovax: Not applicable ?- Prevnar: Not applicable ?- HPV: Not applicable ?- Zostavax vaccine: Not applicable ? ?SCREENING: ?- Colonoscopy: Not applicable  ?Discussed with patient purpose of the colonoscopy is to detect colon cancer at curable precancerous or early stages  ? ?- AAA Screening: Not applicable  ?-Hearing Test: Not applicable  ?-Spirometry: Not applicable  ? ?PATIENT COUNSELING:   ? ?Sexuality: Discussed sexually transmitted  diseases, partner selection, use of condoms, avoidance of unintended pregnancy  and contraceptive alternatives.  ? ?Advised to avoid cigarette smoking. ? ?I discussed with the patient that most people either abstain from alcohol or drink within safe limits (<=14/week and <=4 drinks/occasion for males, <=7/weeks and <= 3  drinks/occasion for females) and that the risk for alcohol disorders and other health effects rises proportionally with the number of drinks per week and how often a drinker exceeds daily limits. ? ?Discussed cessation/primary prevention of drug use and availability of treatment for abuse.  ? ?Diet: Encouraged to adjust caloric intake to maintain  or achieve ideal body weight, to reduce intake of dietary saturated fat and total fat, to limit sodium intake by avoiding high sodium foods and not adding table salt, and to maintain adequate dietary potassium and calcium preferably from fresh fruits, vegetables, and low-fat dairy products.   ? ?stressed the importance of regular exercise ? ?Injury prevention: Discussed safety belts, safety helmets, smoke detector, smoking near bedding or upholstery.  ? ?Dental health: Discussed importance of regular tooth brushing, flossing, and dental visits.  ? ?Follow up plan: ?NEXT PREVENTATIVE PHYSICAL DUE IN 1 YEAR. ? ? ?Return in about 1 year (around 07/19/2022) for CPE.  ? ?Gerre ScullLauren A Madhuri Vacca, NP ? ? ?

## 2021-07-18 ENCOUNTER — Ambulatory Visit (INDEPENDENT_AMBULATORY_CARE_PROVIDER_SITE_OTHER): Payer: Managed Care, Other (non HMO) | Admitting: Nurse Practitioner

## 2021-07-18 ENCOUNTER — Encounter: Payer: Self-pay | Admitting: Nurse Practitioner

## 2021-07-18 VITALS — BP 92/60 | HR 52 | Temp 96.6°F | Ht 75.0 in | Wt 168.8 lb

## 2021-07-18 DIAGNOSIS — Z13220 Encounter for screening for lipoid disorders: Secondary | ICD-10-CM

## 2021-07-18 DIAGNOSIS — Z136 Encounter for screening for cardiovascular disorders: Secondary | ICD-10-CM | POA: Diagnosis not present

## 2021-07-18 DIAGNOSIS — Z0000 Encounter for general adult medical examination without abnormal findings: Secondary | ICD-10-CM

## 2021-07-18 LAB — COMPREHENSIVE METABOLIC PANEL
ALT: 13 U/L (ref 0–53)
AST: 15 U/L (ref 0–37)
Albumin: 4.1 g/dL (ref 3.5–5.2)
Alkaline Phosphatase: 59 U/L (ref 39–117)
BUN: 19 mg/dL (ref 6–23)
CO2: 28 mEq/L (ref 19–32)
Calcium: 8.7 mg/dL (ref 8.4–10.5)
Chloride: 102 mEq/L (ref 96–112)
Creatinine, Ser: 1.3 mg/dL (ref 0.40–1.50)
GFR: 71.64 mL/min (ref >60.00)
Glucose, Bld: 130 mg/dL — ABNORMAL HIGH (ref 70–99)
Potassium: 3.7 mEq/L (ref 3.5–5.1)
Sodium: 135 mEq/L (ref 135–145)
Total Bilirubin: 0.3 mg/dL (ref 0.2–1.2)
Total Protein: 6.8 g/dL (ref 6.0–8.3)

## 2021-07-18 LAB — LIPID PANEL
Cholesterol: 127 mg/dL (ref 0–200)
HDL: 38.2 mg/dL — ABNORMAL LOW (ref >39.00)
LDL Cholesterol: 73 mg/dL (ref 0–99)
NonHDL: 88.69
Total CHOL/HDL Ratio: 3
Triglycerides: 80 mg/dL (ref 0.0–149.0)
VLDL: 16 mg/dL (ref 0.0–40.0)

## 2021-07-18 LAB — CBC WITH DIFFERENTIAL/PLATELET
Basophils Absolute: 0 10*3/uL (ref 0.0–0.1)
Basophils Relative: 0.6 % (ref 0.0–3.0)
Eosinophils Absolute: 0.1 10*3/uL (ref 0.0–0.7)
Eosinophils Relative: 3 % (ref 0.0–5.0)
HCT: 40.8 % (ref 39.0–52.0)
Hemoglobin: 13.5 g/dL (ref 13.0–17.0)
Lymphocytes Relative: 28.1 % (ref 12.0–46.0)
Lymphs Abs: 1.2 10*3/uL (ref 0.7–4.0)
MCHC: 33 g/dL (ref 30.0–36.0)
MCV: 95.3 fl (ref 78.0–100.0)
Monocytes Absolute: 0.6 10*3/uL (ref 0.1–1.0)
Monocytes Relative: 15 % — ABNORMAL HIGH (ref 3.0–12.0)
Neutro Abs: 2.3 10*3/uL (ref 1.4–7.7)
Neutrophils Relative %: 53.3 % (ref 43.0–77.0)
Platelets: 196 10*3/uL (ref 150.0–400.0)
RBC: 4.28 Mil/uL (ref 4.22–5.81)
RDW: 13.8 % (ref 11.5–15.5)
WBC: 4.2 10*3/uL (ref 4.0–10.5)

## 2021-07-18 NOTE — Patient Instructions (Signed)
It was great to see you!  We are checking your labs today and will let you know the results via mychart/phone.   Let's follow-up in 1 year, sooner if you have concerns.  If a referral was placed today, you will be contacted for an appointment. Please note that routine referrals can sometimes take up to 3-4 weeks to process. Please call our office if you haven't heard anything after this time frame.  Take care,  Evany Schecter, NP  

## 2023-06-10 ENCOUNTER — Telehealth: Payer: Self-pay

## 2023-06-10 NOTE — Transitions of Care (Post Inpatient/ED Visit) (Signed)
   06/10/2023  Name: Brian Blevins MRN: [id number] DOB: 1985-08-20  Today's TOC FU Call Status: Today's TOC FU Call Status:: Successful TOC FU Call Completed TOC FU Call Complete Date: 06/10/23 Patient's Name and Date of Birth confirmed.  Transition Care Management Follow-up Telephone Call Date of Discharge: 06/09/23 Discharge Facility: Other [facility]) Name of Other (Non[facility]) Discharge Facility: [facility] Type of Discharge: Emergency Department Reason for ED Visit: Other: (left wrist pain) How have you been since you were released from the hospital?: Better Any questions or concerns?: No  Items Reviewed: Did you receive and understand the discharge instructions provided?: Yes Medications obtained,verified, and reconciled?: Yes (Medications Reviewed) Any new allergies since your discharge?: No Dietary orders reviewed?: NA Do you have support at home?: No  Medications Reviewed Today: Medications Reviewed Today     Reviewed by Leroy Kennedy, CMA (Certified Medical Assistant) on 06/10/23 at 1051  Med List Status: <None>   Medication Order Taking? Sig Documenting Provider Last Dose Status Informant  famotidine (PEPCID) 20 MG tablet [id number] Yes Take 1 tablet (20 mg total) by mouth 2 (two) times daily. Marisa Severin, MD Taking Active   ondansetron [facility] ODT) 8 MG disintegrating tablet [id number] Yes Take 1 tablet (8 mg total) by mouth every 8 (eight) hours as needed for nausea or vomiting. Marisa Severin, MD Taking Active   Med List Note Charlton Haws 06/24/13 3664): Doesn't have a pharmacy he uses regularly             Home Care and Equipment/Supplies: Were Home Health Services Ordered?: NA Any new equipment or medical supplies ordered?: NA  Functional Questionnaire: Do you need assistance with bathing/showering or dressing?: No Do you need assistance with meal preparation?: No Do you need assistance with eating?: No Do you have difficulty  maintaining continence: No Do you need assistance with getting out of bed/getting out of a chair/moving?: No Do you have difficulty managing or taking your medications?: No  Follow up appointments reviewed: PCP Follow-up appointment confirmed?: NA (Pt is currently living in [place]) [facility] Follow-up appointment confirmed?: NA Do you need transportation to your follow-up appointment?: No Do you understand care options if your condition(s) worsen?: Yes-patient verbalized understanding    SIGNATURE Jodelle Green, RMA

## 2023-06-10 NOTE — Telephone Encounter (Signed)
 Pt is currently living in [place].

## 2023-06-11 NOTE — Telephone Encounter (Signed)
 Removed Rodman Pickle, NP as PCP
